# Patient Record
Sex: Female | Born: 1960
Health system: Western US, Academic
[De-identification: ages and names within clinical notes are randomized; demographics above are authoritative.]

## PROBLEM LIST (undated history)

## (undated) DIAGNOSIS — E119 Type 2 diabetes mellitus without complications: Secondary | ICD-10-CM

## (undated) DIAGNOSIS — Z9109 Other allergy status, other than to drugs and biological substances: Secondary | ICD-10-CM

## (undated) DIAGNOSIS — I10 Essential (primary) hypertension: Secondary | ICD-10-CM

## (undated) DIAGNOSIS — Z9221 Personal history of antineoplastic chemotherapy: Secondary | ICD-10-CM

## (undated) HISTORY — PX: REPLACEMENT TOTAL KNEE BILATERAL: SUR1225

## (undated) HISTORY — PX: ABDOMINAL HYSTERECTOMY: SHX81

## (undated) HISTORY — PX: ADENOIDECTOMY: SUR15

## (undated) HISTORY — PX: CATARACT EXTRACTION: SUR2

## (undated) HISTORY — PX: BILATERAL CARPAL TUNNEL RELEASE: SHX6508

---

## 2004-05-25 ENCOUNTER — Ambulatory Visit: Payer: Self-pay

## 2005-09-25 ENCOUNTER — Ambulatory Visit: Payer: Self-pay | Admitting: Unknown Physician Specialty

## 2006-12-10 ENCOUNTER — Ambulatory Visit: Payer: Self-pay

## 2009-09-12 ENCOUNTER — Ambulatory Visit: Payer: Self-pay

## 2010-09-28 ENCOUNTER — Ambulatory Visit: Payer: Self-pay

## 2011-11-28 ENCOUNTER — Ambulatory Visit: Payer: Self-pay

## 2013-04-29 ENCOUNTER — Ambulatory Visit: Payer: Self-pay

## 2013-05-06 ENCOUNTER — Ambulatory Visit: Payer: Self-pay | Admitting: Obstetrics and Gynecology

## 2013-05-06 LAB — BASIC METABOLIC PANEL
ANION GAP: 3 — AB (ref 7–16)
BUN: 20 mg/dL — ABNORMAL HIGH (ref 7–18)
CALCIUM: 8.6 mg/dL (ref 8.5–10.1)
Chloride: 107 mmol/L (ref 98–107)
Co2: 27 mmol/L (ref 21–32)
Creatinine: 0.74 mg/dL (ref 0.60–1.30)
EGFR (African American): >60
Glucose: 73 mg/dL (ref 65–99)
Osmolality: 275 (ref 275–301)
POTASSIUM: 3.4 mmol/L — AB (ref 3.5–5.1)
Sodium: 137 mmol/L (ref 136–145)

## 2013-05-06 LAB — PREGNANCY, URINE: Pregnancy Test, Urine: NEGATIVE m[IU]/mL

## 2013-05-06 LAB — HEMOGLOBIN: HGB: 13.1 g/dL (ref 12.0–16.0)

## 2013-05-08 ENCOUNTER — Ambulatory Visit: Payer: Self-pay | Admitting: Obstetrics and Gynecology

## 2013-05-11 LAB — PATHOLOGY REPORT

## 2013-06-01 ENCOUNTER — Ambulatory Visit: Payer: Self-pay | Admitting: Obstetrics and Gynecology

## 2013-06-02 LAB — BASIC METABOLIC PANEL
Anion Gap: 7 (ref 7–16)
BUN: 9 mg/dL (ref 7–18)
CALCIUM: 8 mg/dL — AB (ref 8.5–10.1)
Chloride: 108 mmol/L — ABNORMAL HIGH (ref 98–107)
Co2: 25 mmol/L (ref 21–32)
Creatinine: 0.8 mg/dL (ref 0.60–1.30)
EGFR (African American): >60
EGFR (Non-African Amer.): >60
GLUCOSE: 106 mg/dL — AB (ref 65–99)
OSMOLALITY: 279 (ref 275–301)
Potassium: 3.9 mmol/L (ref 3.5–5.1)
SODIUM: 140 mmol/L (ref 136–145)

## 2013-06-02 LAB — PATHOLOGY REPORT

## 2013-06-02 LAB — HEMATOCRIT: HCT: 32.7 % — ABNORMAL LOW (ref 35.0–47.0)

## 2013-09-08 DIAGNOSIS — E559 Vitamin D deficiency, unspecified: Secondary | ICD-10-CM | POA: Insufficient documentation

## 2013-09-24 ENCOUNTER — Ambulatory Visit: Payer: Self-pay | Admitting: Obstetrics and Gynecology

## 2013-09-25 ENCOUNTER — Ambulatory Visit: Payer: Self-pay | Admitting: Obstetrics and Gynecology

## 2014-03-01 ENCOUNTER — Ambulatory Visit: Payer: Self-pay | Admitting: Gastroenterology

## 2014-06-26 NOTE — Op Note (Signed)
PATIENT NAME:  Sandra Randall, Sandra Randall MR#:  [id number] DATE OF BIRTH:  1960-11-13  DATE OF PROCEDURE:  06/01/2013  PREOPERATIVE DIAGNOSIS: No urine output visualized from ureteral orifices.   POSTOPERATIVE DIAGNOSIS: No urine output visualized from ureteral orifices.   PROCEDURE: Cystoscopy with bilateral ureteral catheterization.   SURGEON: Maryan Puls, M.D.   ANESTHETIST: Dr. Kayleen Memos.   ANESTHETIC METHOD: General.   INDICATIONS: Intraoperative consultation was requested by Dr. Ouida Sills. He administered methylene blue and had not visualized any urine output from either ureter cystoscopically even after waiting 30 minutes. The patient had undergone vaginal hysterectomy. There is no history of urologic disease.   OPERATIVE SUMMARY: The 21-French cystoscope was coupled with the camera and then placed into the bladder. The bladder was thoroughly inspected. Both ureteral orifices were identified. Left orifice did have clear efflux. Right orifice did not have efflux during the 10 minutes I visualized it. At this point, a 0.035 Glidewire was easily advanced into the right orifice up to the kidney. No obstruction was encountered. The Glidewire wire was then easily advanced up the left ureter again without obstruction. At this point, the scope was removed. The procedure was terminated.   ____________________________ Otelia Limes. Yves Dill, MD mrw:aw D: 06/01/2013 11:56:22 ET T: 06/01/2013 12:31:12 ET JOB#: [id number]  cc: Otelia Limes. Yves Dill, MD, <Dictator> Royston Cowper MD ELECTRONICALLY SIGNED 06/01/2013 17:42

## 2014-06-26 NOTE — Op Note (Signed)
PATIENT NAME:  Sandra Randall, GUIDOTTI MR#:  [id number] DATE OF BIRTH:  1960-06-17  DATE OF PROCEDURE:  05/08/2013  PREOPERATIVE DIAGNOSIS: Menorrhagia.   POSTOPERATIVE DIAGNOSIS: Menorrhagia.   PROCEDURE: Fractional dilation and curettage.   SURGEON: Boykin Nearing, M.D.   INDICATION: A 54 year old gravida 2, para 1 patient with severe menorrhagia. The patient is  failing high dose OCPs. Endometrial biopsy pending from the office.   DESCRIPTION OF PROCEDURE: After adequate general endotracheal anesthesia, the patient was placed in the dorsal supine position with legs in the candycane stirrups. Abdomen, perineum and vagina were prepped in normal sterile fashion. A weighted speculum was placed in the posterior vaginal vault and the anterior vagina was elevated with Sims retractor and the cervix was grasped with a single-tooth tenaculum. Previously, the patient's bladder was straight catheterized yielding 150 mL of clear urine. An endocervical curettage was performed with scant tissue. The uterus the was then sounded, sounds to 9.5 cm. The cervix was then dilated to #20 Hanks dilator without difficulty and a sharp curettage was performed with moderate amount of endometrial tissue. Good uterine cry throughout and the procedure was terminated. Good hemostasis was noted. Silver nitrate was applied at the tenacula sites for hemostasis. No complications. The patient tolerated the procedure well and was taken to the recovery room in good condition. Estimated blood loss minimal. Intraoperative fluids were 650 mL. The patient was taken to the recovery room in good condition.  ____________________________ Boykin Nearing, MD tjs:aw D: 05/08/2013 11:04:28 ET T: 05/08/2013 11:15:28 ET JOB#: [id number]  cc: Boykin Nearing, MD, <Dictator> Boykin Nearing MD ELECTRONICALLY SIGNED 05/11/2013 7:51

## 2014-06-26 NOTE — Op Note (Signed)
PATIENT NAME:  Sandra Randall, BOEREMA MR#:  [id number] DATE OF BIRTH:  [birth year]  DATE OF PROCEDURE:  06/01/2013  PREOPERATIVE DIAGNOSIS: Menorrhagia failing both conservative treatment and ablative treatment.   POSTOPERATIVE DIAGNOSES:  1.  Menorrhagia failing both conservative treatment and ablative treatment. 2.  Fibroid uterus.   PROCEDURES:  1.  Total vaginal hysterectomy.  2.  Cystoscopy. 3.  Ureteral stenting.   SURGEON: Laverta Baltimore, MD.  ANESTHESIA: General endotracheal anesthesia.   INTRAOPERATIVE CONSULTATION: Dr. Rogers Blocker, urologist.   INDICATION: This is a 54 year old gravida 2, para 2 patient with a long history of significant menorrhagia. The patient was tried multiple times on hormonal therapy including high-dose birth control pills, which have failed. The patient had NovaSure endometrial ablation, which also failed to control her bleeding.   FINDINGS: Approximately a 13 week size uterus with multiple fibroids throughout. Cystoscopy performed at the end of the case given the difficult dissection and morcellation. Initial evaluation failed to show methylene blue coming from the ureteral ostia; therefore, Dr. Eliberto Ivory, urologist came in and performed retrograde ureteral stenting and ureterals were patent bilateral.   PROCEDURE: After adequate general endotracheal anesthesia, the patient was placed in the dorsal supine position, legs were placed in the candycane stirrups. The patient did have sequential calf devices on during the procedure. The patient received 2 grams of cefoxitin and 80 mg of gentamicin prophylactically before the start of the case. The patient was prepped and draped in normal sterile fashion. The bladder was catheterized with a red Robinson catheter yielding 75 mL clear urine. A weighted speculum was placed in the posterior vaginal vault and the cervix was circumferentially injected with 1% lidocaine with 1:100,000 epinephrine. A direct posterior colpotomy  incision was made and a long bill weighted speculum was placed. The uterosacral ligaments were bilaterally clamped, transected and suture ligated with 0 Vicryl suture. The anterior cervix was circumferentially incised with the Bovie. The cardinal ligaments were then clamped bilaterally transected and suture ligated with 0 Vicryl suture. Several additional clamps were used to clamp the uterine arteries bilaterally. These were transected and suture ligated with 0 Vicryl suture. Given the girth of the uterus, morcellation was then performed with a coring of the cervix and central portion of the uterus. Ultimately multiple fibroids and multiple pieces of the uterus were removed and the cornua were then visualized and clamped, transected and suture ligated with 0 Vicryl suture. Fallopian tubes were high. The ovaries appeared normal. Given the difficulty in her anatomy, the fallopian tubes were kept in situ. The pedicles appeared hemostatic. Two additional figure-of-eight sutures were applied on both sides to control slow oozing and ultimately good hemostasis was achieved.   The vaginal cuff was then closed with interrupted 0 Vicryl suture with good closure and good hemostasis. Given the difficult dissection and the amount of dissection that took place, the surgeon felt it was prudent to look at the ureters via cystoscope. The patient received an amp of methylene blue and the bladder was filled with lactated Ringer's while performing cystoscopy. Ureters were identified and after 20 minutes, there was no pluming effect noted and no methylene blue; therefore, Dr. Rogers Blocker, urologist on call was called in for intraoperative consultation. Dr. Rogers Blocker performed retrograde ureteral stenting and the stent passed without difficulty, therefore assuring that that ureters were not tied off. The left ureter did show a pluming of urine again without methylene blue. The right ureter never showed any pluming of either urine or methylene  blue. The stents were  removed, the bladder was drained and a Foley catheter was placed. Estimated blood loss 300 mL. Intraoperative fluids 2000 mL. There were no complications. The patient was taken to the recovery room in good condition.   ____________________________ Boykin Nearing, MD tjs:aw D: 06/01/2013 12:25:46 ET T: 06/01/2013 12:44:27 ET JOB#: [id number]  cc: Boykin Nearing, MD, <Dictator> Boykin Nearing MD ELECTRONICALLY SIGNED 06/05/2013 9:53

## 2015-03-03 DIAGNOSIS — Z6841 Body Mass Index (BMI) 40.0 and over, adult: Secondary | ICD-10-CM | POA: Insufficient documentation

## 2015-08-24 DIAGNOSIS — I480 Paroxysmal atrial fibrillation: Secondary | ICD-10-CM | POA: Insufficient documentation

## 2015-09-22 DIAGNOSIS — E785 Hyperlipidemia, unspecified: Secondary | ICD-10-CM | POA: Insufficient documentation

## 2015-09-22 DIAGNOSIS — E1169 Type 2 diabetes mellitus with other specified complication: Secondary | ICD-10-CM | POA: Insufficient documentation

## 2015-09-27 ENCOUNTER — Other Ambulatory Visit: Payer: Self-pay | Admitting: Obstetrics and Gynecology

## 2015-09-27 DIAGNOSIS — Z1231 Encounter for screening mammogram for malignant neoplasm of breast: Secondary | ICD-10-CM

## 2015-09-29 ENCOUNTER — Ambulatory Visit: Admission: RE | Admit: 2015-09-29 | Payer: Self-pay | Source: Ambulatory Visit

## 2015-09-29 ENCOUNTER — Other Ambulatory Visit: Payer: Self-pay | Admitting: Obstetrics and Gynecology

## 2015-09-29 ENCOUNTER — Ambulatory Visit
Admission: RE | Admit: 2015-09-29 | Discharge: 2015-09-29 | Disposition: A | Discharge status: Home / Self Care | Payer: BC Managed Care – PPO | Source: Ambulatory Visit | Attending: Obstetrics and Gynecology | Admitting: Obstetrics and Gynecology

## 2015-09-29 DIAGNOSIS — Z1231 Encounter for screening mammogram for malignant neoplasm of breast: Secondary | ICD-10-CM

## 2015-10-24 ENCOUNTER — Ambulatory Visit: Payer: BC Managed Care – PPO | Admitting: Dietician

## 2015-10-31 ENCOUNTER — Encounter: Payer: Self-pay | Admitting: Dietician

## 2015-10-31 NOTE — Progress Notes (Signed)
Patient missed initial MNT appointment on 10/24/15, and has not called back to reschedule. Sent letter to MD.

## 2015-11-15 DIAGNOSIS — G4731 Primary central sleep apnea: Secondary | ICD-10-CM | POA: Insufficient documentation

## 2016-12-07 ENCOUNTER — Other Ambulatory Visit: Payer: Self-pay | Admitting: Obstetrics and Gynecology

## 2016-12-07 DIAGNOSIS — Z1231 Encounter for screening mammogram for malignant neoplasm of breast: Secondary | ICD-10-CM

## 2017-01-08 ENCOUNTER — Other Ambulatory Visit: Payer: Self-pay | Admitting: Obstetrics and Gynecology

## 2017-01-08 ENCOUNTER — Ambulatory Visit
Admission: RE | Admit: 2017-01-08 | Discharge: 2017-01-08 | Disposition: A | Discharge status: Home / Self Care | Payer: BC Managed Care – PPO | Source: Ambulatory Visit | Attending: Obstetrics and Gynecology | Admitting: Obstetrics and Gynecology

## 2017-01-08 DIAGNOSIS — Z1231 Encounter for screening mammogram for malignant neoplasm of breast: Secondary | ICD-10-CM

## 2017-10-12 DIAGNOSIS — I152 Hypertension secondary to endocrine disorders: Secondary | ICD-10-CM | POA: Insufficient documentation

## 2018-01-20 ENCOUNTER — Other Ambulatory Visit: Payer: Self-pay | Admitting: Obstetrics and Gynecology

## 2018-01-20 DIAGNOSIS — Z1231 Encounter for screening mammogram for malignant neoplasm of breast: Secondary | ICD-10-CM

## 2018-02-05 ENCOUNTER — Ambulatory Visit
Admission: RE | Admit: 2018-02-05 | Discharge: 2018-02-05 | Disposition: A | Discharge status: Home / Self Care | Payer: BC Managed Care – PPO | Source: Ambulatory Visit | Attending: Obstetrics and Gynecology | Admitting: Obstetrics and Gynecology

## 2018-02-05 ENCOUNTER — Encounter (INDEPENDENT_AMBULATORY_CARE_PROVIDER_SITE_OTHER): Payer: Self-pay

## 2018-02-05 DIAGNOSIS — Z1231 Encounter for screening mammogram for malignant neoplasm of breast: Secondary | ICD-10-CM | POA: Diagnosis not present

## 2018-04-07 ENCOUNTER — Encounter (INDEPENDENT_AMBULATORY_CARE_PROVIDER_SITE_OTHER): Payer: BC Managed Care – PPO | Admitting: Vascular Surgery

## 2018-04-23 ENCOUNTER — Encounter (INDEPENDENT_AMBULATORY_CARE_PROVIDER_SITE_OTHER): Payer: BC Managed Care – PPO | Admitting: Vascular Surgery

## 2019-01-02 ENCOUNTER — Other Ambulatory Visit: Payer: Self-pay | Admitting: Obstetrics and Gynecology

## 2019-01-02 DIAGNOSIS — Z1231 Encounter for screening mammogram for malignant neoplasm of breast: Secondary | ICD-10-CM

## 2019-02-09 ENCOUNTER — Ambulatory Visit: Payer: BC Managed Care – PPO

## 2019-02-13 ENCOUNTER — Other Ambulatory Visit: Payer: Self-pay

## 2019-02-13 ENCOUNTER — Ambulatory Visit
Admission: RE | Admit: 2019-02-13 | Discharge: 2019-02-13 | Disposition: A | Discharge status: Home / Self Care | Payer: BC Managed Care – PPO | Source: Ambulatory Visit | Attending: Obstetrics and Gynecology | Admitting: Obstetrics and Gynecology

## 2019-02-13 DIAGNOSIS — Z1231 Encounter for screening mammogram for malignant neoplasm of breast: Secondary | ICD-10-CM | POA: Insufficient documentation

## 2019-04-13 ENCOUNTER — Ambulatory Visit (INDEPENDENT_AMBULATORY_CARE_PROVIDER_SITE_OTHER): Payer: BC Managed Care – PPO

## 2019-04-13 ENCOUNTER — Ambulatory Visit
Admission: EM | Admit: 2019-04-13 | Discharge: 2019-04-13 | Disposition: A | Discharge status: Home / Self Care | Payer: BC Managed Care – PPO | Attending: Emergency Medicine | Admitting: Emergency Medicine

## 2019-04-13 ENCOUNTER — Other Ambulatory Visit: Payer: Self-pay

## 2019-04-13 DIAGNOSIS — D649 Anemia, unspecified: Secondary | ICD-10-CM | POA: Diagnosis present

## 2019-04-13 DIAGNOSIS — R05 Cough: Secondary | ICD-10-CM

## 2019-04-13 DIAGNOSIS — R519 Headache, unspecified: Secondary | ICD-10-CM | POA: Diagnosis not present

## 2019-04-13 DIAGNOSIS — E119 Type 2 diabetes mellitus without complications: Secondary | ICD-10-CM | POA: Insufficient documentation

## 2019-04-13 DIAGNOSIS — G4733 Obstructive sleep apnea (adult) (pediatric): Secondary | ICD-10-CM | POA: Insufficient documentation

## 2019-04-13 DIAGNOSIS — I10 Essential (primary) hypertension: Secondary | ICD-10-CM | POA: Insufficient documentation

## 2019-04-13 DIAGNOSIS — J3489 Other specified disorders of nose and nasal sinuses: Secondary | ICD-10-CM | POA: Diagnosis not present

## 2019-04-13 DIAGNOSIS — R0609 Other forms of dyspnea: Secondary | ICD-10-CM | POA: Insufficient documentation

## 2019-04-13 DIAGNOSIS — R0600 Dyspnea, unspecified: Secondary | ICD-10-CM

## 2019-04-13 DIAGNOSIS — Z7984 Long term (current) use of oral hypoglycemic drugs: Secondary | ICD-10-CM | POA: Diagnosis not present

## 2019-04-13 DIAGNOSIS — M5136 Other intervertebral disc degeneration, lumbar region: Secondary | ICD-10-CM | POA: Insufficient documentation

## 2019-04-13 DIAGNOSIS — Z20822 Contact with and (suspected) exposure to covid-19: Secondary | ICD-10-CM | POA: Insufficient documentation

## 2019-04-13 DIAGNOSIS — M51369 Other intervertebral disc degeneration, lumbar region without mention of lumbar back pain or lower extremity pain: Secondary | ICD-10-CM | POA: Insufficient documentation

## 2019-04-13 DIAGNOSIS — Z79899 Other long term (current) drug therapy: Secondary | ICD-10-CM | POA: Insufficient documentation

## 2019-04-13 DIAGNOSIS — M5416 Radiculopathy, lumbar region: Secondary | ICD-10-CM | POA: Insufficient documentation

## 2019-04-13 DIAGNOSIS — Z7901 Long term (current) use of anticoagulants: Secondary | ICD-10-CM | POA: Diagnosis not present

## 2019-04-13 DIAGNOSIS — R0982 Postnasal drip: Secondary | ICD-10-CM | POA: Insufficient documentation

## 2019-04-13 HISTORY — DX: Type 2 diabetes mellitus without complications: E11.9

## 2019-04-13 HISTORY — DX: Other allergy status, other than to drugs and biological substances: Z91.09

## 2019-04-13 HISTORY — DX: Essential (primary) hypertension: I10

## 2019-04-13 LAB — CBC WITH DIFFERENTIAL/PLATELET
Abs Immature Granulocytes: 0.04 10*3/uL (ref 0.00–0.07)
Basophils Absolute: 0.1 10*3/uL (ref 0.0–0.1)
Basophils Relative: 1 %
Eosinophils Absolute: 0 10*3/uL (ref 0.0–0.5)
Eosinophils Relative: 0 %
HCT: 24.4 % — ABNORMAL LOW (ref 36.0–46.0)
Hemoglobin: 6.5 g/dL — ABNORMAL LOW (ref 12.0–15.0)
Immature Granulocytes: 1 %
Lymphocytes Relative: 23 %
Lymphs Abs: 1.6 10*3/uL (ref 0.7–4.0)
MCH: 19.6 pg — ABNORMAL LOW (ref 26.0–34.0)
MCHC: 26.6 g/dL — ABNORMAL LOW (ref 30.0–36.0)
MCV: 73.7 fL — ABNORMAL LOW (ref 80.0–100.0)
Monocytes Absolute: 0.5 10*3/uL (ref 0.1–1.0)
Monocytes Relative: 7 %
Neutro Abs: 4.7 10*3/uL (ref 1.7–7.7)
Neutrophils Relative %: 68 %
Platelets: 211 10*3/uL (ref 150–400)
RBC: 3.31 MIL/uL — ABNORMAL LOW (ref 3.87–5.11)
RDW: 21.5 % — ABNORMAL HIGH (ref 11.5–15.5)
WBC: 7 10*3/uL (ref 4.0–10.5)
nRBC: 0.3 % — ABNORMAL HIGH (ref 0.0–0.2)

## 2019-04-13 LAB — BASIC METABOLIC PANEL
Anion gap: 9 (ref 5–15)
BUN: 13 mg/dL (ref 6–20)
CO2: 23 mmol/L (ref 22–32)
Calcium: 8.9 mg/dL (ref 8.9–10.3)
Chloride: 104 mmol/L (ref 98–111)
Creatinine, Ser: 0.81 mg/dL (ref 0.44–1.00)
GFR calc Af Amer: >60 mL/min (ref >60)
GFR calc non Af Amer: >60 mL/min (ref >60)
Glucose, Bld: 163 mg/dL — ABNORMAL HIGH (ref 70–99)
Potassium: 3.8 mmol/L (ref 3.5–5.1)
Sodium: 136 mmol/L (ref 135–145)

## 2019-04-13 LAB — BRAIN NATRIURETIC PEPTIDE: B Natriuretic Peptide: 77 pg/mL (ref 0.0–100.0)

## 2019-04-13 NOTE — Discharge Instructions (Addendum)
Go immediately to the emergency department.  Your hemoglobin is 6.5, you do qualify for transfusion.  I believe this is the cause of your shortness of breath.

## 2019-04-13 NOTE — ED Triage Notes (Signed)
Pt presents with c/o shob w/exertion, dry cough and some nasal irritation, symptoms present about a week. Pt also reports all-over itching, denies rash. She does report dry skin, but the itching keeps her awake at night. Pt denies any known sick contacts.

## 2019-04-13 NOTE — ED Provider Notes (Signed)
HPI  SUBJECTIVE:  Sandra Randall is a 59 y.o. female who presents with dyspnea on exertion to about 50 feet.  States that she has to stop and rest after that distance.  This has been going on for over a week.  She denies shortness of breath at rest.  She reports cough productive of yellowish-green phlegm, left frontal sinus pain and pressure/headache with yellow-green rhinorrhea worse in the morning.  She reports postnasal drip.  No fevers, wheezing, chest pain, abdominal pain.  No unintentional weight gain, lower extremity edema, nocturia, orthopnea.  No body aches, sore throat, loss of sense of smell or taste nausea, vomiting, diarrhea.  No known Covid exposure.  No upper dental pain, facial swelling.  No chest pressure, heaviness, nausea, diaphoresis with exertion or at rest.  No surgery in the past 4 weeks, recent immobilization, hemoptysis, exogenous estrogen.  No antibiotics in the past 3 months.  No antipyretic in the past 4 to 6 hours.  She tried changing her allergy medications from Claritin and Flonase to Zyrtec and a prescription nasal spray without improvement in her symptoms.  Symptoms are worse with exertion.  She was in her usual state of health up until the past week.  She has never had symptoms like this before.  Denies epistaxis, vaginal bleeding, hematuria, melena, hematochezia.  She has a past medical history of obstructive sleep apnea on CPAP, diabetes, hypertension, atrial fibrillation for which she takes Eliquis.  She has a new adult onset murmur that was evaluated with an echo last month.  EF 55%.  She has a history of allergies.  No history of asthma, GERD, PE, DVT, mitral valve prolapse, aortic stenosis, CHF, anemia.  PMD: Ezequiel Kayser, MD    Past Medical History:  Diagnosis Date  . Diabetes mellitus without complication ([place])   . Environmental allergies   . Hypertension     Past Surgical History:  Procedure Laterality Date  . ABDOMINAL HYSTERECTOMY    .  ABDOMINAL HYSTERECTOMY    . ADENOIDECTOMY    . BILATERAL CARPAL TUNNEL RELEASE    . CATARACT EXTRACTION Bilateral   . REPLACEMENT TOTAL KNEE BILATERAL      Family History  Problem Relation Age of Onset  . Lung cancer Mother   . Hypertension Mother   . Lung cancer Father   . Breast cancer Neg Hx     Social History   Tobacco Use  . Smoking status: Never Smoker  . Smokeless tobacco: Never Used  Substance Use Topics  . Alcohol use: Not Currently  . Drug use: Never    No current facility-administered medications for this encounter.  Current Outpatient Medications:  .  amLODipine (NORVASC) 5 MG tablet, Take 5 mg by mouth daily., Disp: , Rfl:  .  cetirizine (ZYRTEC) 10 MG tablet, Take 10 mg by mouth daily., Disp: , Rfl:  .  Cholecalciferol 50 MCG (2000 UT) TABS, Take 2,000 Units by mouth daily., Disp: , Rfl:  .  ELIQUIS 5 MG TABS tablet, Take 5 mg by mouth 2 (two) times daily., Disp: , Rfl:  .  fluticasone (FLONASE) 50 MCG/ACT nasal spray, Place 2 sprays into the nose daily as needed., Disp: , Rfl:  .  liraglutide (VICTOZA) 18 MG/3ML SOPN, Inject 3 mg into the skin daily., Disp: , Rfl:  .  metFORMIN (GLUCOPHAGE) 500 MG tablet, Take 500 mg by mouth 2 (two) times daily., Disp: , Rfl:  .  metoprolol succinate (TOPROL-XL) 50 MG 24 hr tablet, Take 50  mg by mouth daily., Disp: , Rfl:  .  montelukast (SINGULAIR) 10 MG tablet, Take 10 mg by mouth at bedtime., Disp: , Rfl:  .  Multiple Vitamin (MULTIVITAMIN) capsule, Take by mouth., Disp: , Rfl:   Allergies  Allergen Reactions  . Adhesive [Tape] Rash     ROS  As noted in HPI.   Physical Exam  BP (!) 117/51 (BP Location: Left Arm)   Pulse 91   Temp 98.2 F (36.8 C) (Oral)   Ht 5\' 7"  (1.702 m)   SpO2 99%   Constitutional: Well developed, well nourished, no acute distress Eyes: PERRL, EOMI, conjunctiva normal bilaterally HENT: Normocephalic, atraumatic,mucus membranes moist.  Positive left frontal sinus tenderness.   Erythematous, swollen turbinates.  Mucoid nasal congestion. Respiratory: Clear to auscultation bilaterally, no rales, no wheezing, no rhonchi.  No anterior lateral chest wall tenderness. Cardiovascular: Normal rate and rhythm, positive systolic ejection murmur, no gallops, no rubs.  No JVD.  Able to tolerate lying flat. GI: Soft, nondistended, normal bowel sounds, nontender, no rebound, no guarding.  No appreciable hepatomegaly. skin: No rash, skin intact Musculoskeletal: Calves symmetric nontender bilaterally, 1-2+ pitting edema bilaterally, no tenderness, no deformities Neurologic: Alert & oriented x 3, CN III-XII grossly intact, no motor deficits, sensation grossly intact Psychiatric: Speech and behavior appropriate   ED Course   Medications - No data to display  Orders Placed This Encounter  Procedures  . Novel Coronavirus, NAA (Hosp order, Send-out to Ref Lab; TAT 18-24 hrs    Standing Status:   Standing    Number of Occurrences:   1    Order Specific Question:   Is this test for diagnosis or screening    Answer:   Screening    Order Specific Question:   Symptomatic for COVID-19 as defined by CDC    Answer:   No    Order Specific Question:   Hospitalized for COVID-19    Answer:   No    Order Specific Question:   Admitted to ICU for COVID-19    Answer:   No    Order Specific Question:   Previously tested for COVID-19    Answer:   No    Order Specific Question:   Resident in a congregate (group) care setting    Answer:   No    Order Specific Question:   Employed in healthcare setting    Answer:   No    Order Specific Question:   Pregnant    Answer:   No  . DG Chest 2 View    Standing Status:   Standing    Number of Occurrences:   1    Order Specific Question:   Reason for Exam (SYMPTOM  OR DIAGNOSIS REQUIRED)    Answer:   DOE x 1 week r/o effusion pulm edema  . CBC with Differential    Standing Status:   Standing    Number of Occurrences:   1  . Basic metabolic panel     Standing Status:   Standing    Number of Occurrences:   1  . Brain natriuretic peptide    Standing Status:   Standing    Number of Occurrences:   1  . ED EKG    Standing Status:   Standing    Number of Occurrences:   1    Order Specific Question:   Reason for Exam    Answer:   Shortness of breath  . EKG 12-Lead  Standing Status:   Standing    Number of Occurrences:   1   Results for orders placed or performed during the hospital encounter of 04/13/19 (from the past 24 hour(s))  CBC with Differential     Status: Abnormal   Collection Time: 04/13/19  5:20 PM  Result Value Ref Range   WBC 7.0 4.0 - 10.5 K/uL   RBC 3.31 (L) 3.87 - 5.11 MIL/uL   Hemoglobin 6.5 (L) 12.0 - 15.0 g/dL   HCT 24.4 (L) 36.0 - 46.0 %   MCV 73.7 (L) 80.0 - 100.0 fL   MCH 19.6 (L) 26.0 - 34.0 pg   MCHC 26.6 (L) 30.0 - 36.0 g/dL   RDW 21.5 (H) 11.5 - 15.5 %   Platelets 211 150 - 400 K/uL   nRBC 0.3 (H) 0.0 - 0.2 %   Neutrophils Relative % 68 %   Neutro Abs 4.7 1.7 - 7.7 K/uL   Lymphocytes Relative 23 %   Lymphs Abs 1.6 0.7 - 4.0 K/uL   Monocytes Relative 7 %   Monocytes Absolute 0.5 0.1 - 1.0 K/uL   Eosinophils Relative 0 %   Eosinophils Absolute 0.0 0.0 - 0.5 K/uL   Basophils Relative 1 %   Basophils Absolute 0.1 0.0 - 0.1 K/uL   Immature Granulocytes 1 %   Abs Immature Granulocytes 0.04 0.00 - 0.07 K/uL  Basic metabolic panel     Status: Abnormal   Collection Time: 04/13/19  5:20 PM  Result Value Ref Range   Sodium 136 135 - 145 mmol/L   Potassium 3.8 3.5 - 5.1 mmol/L   Chloride 104 98 - 111 mmol/L   CO2 23 22 - 32 mmol/L   Glucose, Bld 163 (H) 70 - 99 mg/dL   BUN 13 6 - 20 mg/dL   Creatinine, Ser 0.81 0.44 - 1.00 mg/dL   Calcium 8.9 8.9 - 10.3 mg/dL   GFR calc non Af Amer >60 >60 mL/min   GFR calc Af Amer >60 >60 mL/min   Anion gap 9 5 - 15   DG Chest 2 View  Result Date: 04/13/2019 CLINICAL DATA:  59 year old female with shortness of breath. EXAM: CHEST - 2 VIEW COMPARISON:  None.  FINDINGS: The heart size and mediastinal contours are within normal limits. Both lungs are clear. The visualized skeletal structures are unremarkable. IMPRESSION: No active cardiopulmonary disease. Electronically Signed   By: Anner Crete M.D.   On: 04/13/2019 17:28    ED Clinical Impression  1. Anemia, unspecified type   2. DOE (dyspnea on exertion)      ED Assessment/Plan  Outside labs, echo reports reviewed.  Last BUN/creatinine on 03/2019 18/0.7.  No CBC since 2018.  as noted in HPI  In the differential is CHF pneumonia bronchospasm acute anemia Covid infection.  PE, malignancy lower.  Modified Wells score 0/9. D-dimer was not done.  Covid PCR CBC BMP chest x-ray BNP sent.  Contact patient at [phone] if BMP positive.  If Covid positive she will be a candidate for the infusion clinic due to BMI of 50  EKG: Normal sinus rhythm, rate 91.  Normal axis.  Prolonged QT, incomplete right bundle branch block.  No ST-T wave changes.  No previous EKG images for comparison.  Previous QTC on EKG from 05/14/2016 490.  Reviewed imaging independently.  Normal chest x-ray. See radiology report for full details.  Hemoglobin 6.5.  Patient with acute anemia of unknown etiology, on Eliquis, and qualifies for transfusion.  suspect this  is the cause of her symptoms.  Transferring to the ED for further work-up and  transfusion.  Feel that she is stable to go via private vehicle given duration of symptoms and normal vital signs however feel that this needs to be done urgently because of the anticoagulation.Marland Kitchen  She wishes to go to the [facility].  Discussed labs, imaging, MDM, treatment plan, rationale for transfer to the emergency room.  She agrees with plan.  No orders of the defined types were placed in this encounter.   *This clinic note was created using Dragon dictation software. Therefore, there may be occasional mistakes despite careful proofreading.  ?   Melynda Ripple, MD 04/13/19 1944

## 2019-04-14 ENCOUNTER — Encounter: Payer: Self-pay | Admitting: Emergency Medicine

## 2019-04-14 LAB — NOVEL CORONAVIRUS, NAA (HOSP ORDER, SEND-OUT TO REF LAB; TAT 18-24 HRS): SARS-CoV-2, NAA: NOT DETECTED

## 2019-04-15 MED ORDER — MULTI-VITAMIN PO TABS
1.00 | ORAL_TABLET | ORAL | Status: DC
Start: 2019-04-19 — End: 2019-04-15

## 2019-04-15 MED ORDER — GENERIC EXTERNAL MEDICATION
40.00 | Status: DC
Start: 2019-04-15 — End: 2019-04-15

## 2019-04-15 MED ORDER — LIDOCAINE HCL 1 % IJ SOLN
0.50 | INTRAMUSCULAR | Status: DC
Start: ? — End: 2019-04-15

## 2019-04-15 MED ORDER — GLUCAGON (RDNA) 1 MG IJ KIT
1.00 | PACK | INTRAMUSCULAR | Status: DC
Start: ? — End: 2019-04-15

## 2019-04-15 MED ORDER — INSULIN REGULAR HUMAN 100 UNIT/ML IJ SOLN
0.00 | INTRAMUSCULAR | Status: DC
Start: 2019-04-15 — End: 2019-04-15

## 2019-04-15 MED ORDER — DIPHENHYDRAMINE HCL 25 MG PO CAPS
25.00 | ORAL_CAPSULE | ORAL | Status: DC
Start: ? — End: 2019-04-15

## 2019-04-15 MED ORDER — CHOLECALCIFEROL 25 MCG (1000 UT) PO TABS
2000.00 | ORAL_TABLET | ORAL | Status: DC
Start: 2019-04-19 — End: 2019-04-15

## 2019-04-15 MED ORDER — DEXTROSE 50 % IV SOLN
12.50 | INTRAVENOUS | Status: DC
Start: ? — End: 2019-04-15

## 2019-04-19 MED ORDER — METOPROLOL SUCCINATE ER 50 MG PO TB24
50.00 | ORAL_TABLET | ORAL | Status: DC
Start: 2019-04-19 — End: 2019-04-19

## 2019-04-19 MED ORDER — ACETAMINOPHEN 325 MG PO TABS
650.00 | ORAL_TABLET | ORAL | Status: DC
Start: ? — End: 2019-04-19

## 2019-04-19 MED ORDER — AMLODIPINE BESYLATE 5 MG PO TABS
5.00 | ORAL_TABLET | ORAL | Status: DC
Start: 2019-04-19 — End: 2019-04-19

## 2019-04-19 MED ORDER — HYDROXYZINE HCL 10 MG PO TABS
10.00 | ORAL_TABLET | ORAL | Status: DC
Start: ? — End: 2019-04-19

## 2019-04-19 MED ORDER — INSULIN REGULAR HUMAN 100 UNIT/ML IJ SOLN
0.00 | INTRAMUSCULAR | Status: DC
Start: 2019-04-18 — End: 2019-04-19

## 2019-04-19 MED ORDER — APIXABAN 5 MG PO TABS
5.00 | ORAL_TABLET | ORAL | Status: DC
Start: ? — End: 2019-04-19

## 2019-12-06 ENCOUNTER — Encounter: Payer: Self-pay | Admitting: Gynecology

## 2019-12-06 ENCOUNTER — Other Ambulatory Visit: Payer: Self-pay

## 2019-12-06 ENCOUNTER — Ambulatory Visit
Admission: EM | Admit: 2019-12-06 | Discharge: 2019-12-06 | Disposition: A | Discharge status: Home / Self Care | Payer: BC Managed Care – PPO | Attending: Physician Assistant | Admitting: Physician Assistant

## 2019-12-06 DIAGNOSIS — R5383 Other fatigue: Secondary | ICD-10-CM | POA: Diagnosis present

## 2019-12-06 DIAGNOSIS — D509 Iron deficiency anemia, unspecified: Secondary | ICD-10-CM | POA: Insufficient documentation

## 2019-12-06 LAB — CBC WITH DIFFERENTIAL/PLATELET
Abs Immature Granulocytes: 0.07 10*3/uL (ref 0.00–0.07)
Basophils Absolute: 0.1 10*3/uL (ref 0.0–0.1)
Basophils Relative: 1 %
Eosinophils Absolute: 0 10*3/uL (ref 0.0–0.5)
Eosinophils Relative: 0 %
HCT: 36 % (ref 36.0–46.0)
Hemoglobin: 10.6 g/dL — ABNORMAL LOW (ref 12.0–15.0)
Immature Granulocytes: 1 %
Lymphocytes Relative: 19 %
Lymphs Abs: 1.7 10*3/uL (ref 0.7–4.0)
MCH: 25.7 pg — ABNORMAL LOW (ref 26.0–34.0)
MCHC: 29.4 g/dL — ABNORMAL LOW (ref 30.0–36.0)
MCV: 87.4 fL (ref 80.0–100.0)
Monocytes Absolute: 0.7 10*3/uL (ref 0.1–1.0)
Monocytes Relative: 8 %
Neutro Abs: 6.3 10*3/uL (ref 1.7–7.7)
Neutrophils Relative %: 71 %
Platelets: 212 10*3/uL (ref 150–400)
RBC: 4.12 MIL/uL (ref 3.87–5.11)
RDW: 19 % — ABNORMAL HIGH (ref 11.5–15.5)
WBC: 8.8 10*3/uL (ref 4.0–10.5)
nRBC: 0 % (ref 0.0–0.2)

## 2019-12-06 LAB — BASIC METABOLIC PANEL
Anion gap: 8 (ref 5–15)
BUN: 28 mg/dL — ABNORMAL HIGH (ref 6–20)
CO2: 27 mmol/L (ref 22–32)
Calcium: 9.7 mg/dL (ref 8.9–10.3)
Chloride: 104 mmol/L (ref 98–111)
Creatinine, Ser: 0.81 mg/dL (ref 0.44–1.00)
GFR calc Af Amer: >60 mL/min (ref >60)
GFR calc non Af Amer: >60 mL/min (ref >60)
Glucose, Bld: 112 mg/dL — ABNORMAL HIGH (ref 70–99)
Potassium: 4.3 mmol/L (ref 3.5–5.1)
Sodium: 139 mmol/L (ref 135–145)

## 2019-12-06 NOTE — ED Provider Notes (Signed)
[facility]    CSN: [id number] Arrival date & time: 12/06/19  1411      History   Chief Complaint Chief Complaint  Patient presents with  . Dizziness  . Anemia    HPI Sandra Randall is a 59 y.o. female presenting for feeling increasingly fatigued and dizzy over the past month.  She states that she has a history of iron deficiency anemia has had to have multiple blood transfusions in the past.  Patient states that she is to take Eliquis and has not been taking the medication several months.  Her last blood transfusion was about 6 to 7 months ago.  Patient states that she started to feel like she did before she had to have that transfusion.  Patient states that she does have a hematologist that she goes to, but does not take any thing for her anemia.  Patient denies any history of associated fever, cough, congestion, chest pain, breathing quality, abdominal pain, back pain, nausea, vomiting, diarrhea.  She has not noticed any blood in the stool or in the urine.  Has not noticed any bleeding from any sites and has not had any bruising.  She said her last hemoglobin was done about 2 weeks ago and it was 10.  Patient does have a history of A. fib but says it was paroxysmal she has not had an event in 4 years.  She said her cardiologist felt comfortable taking her off of the Eliquis after she had ablation and did not have recurrence of the A. fib.  Patient denies feeling any palpitations.  She is mostly just concerned about her hemoglobin probably being low requiring another transfusion.  She has no other concerns today.  HPI  Past Medical History:  Diagnosis Date  . Diabetes mellitus without complication ([place])   . Environmental allergies   . Hypertension     Patient Active Problem List   Diagnosis Date Noted  . Lumbar radiculopathy 04/13/2019  . Degeneration of lumbar intervertebral disc 04/13/2019  . Hypertension associated with type 2 diabetes mellitus ([place])  10/12/2017  . 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 Type 2 diabetes mellitus with other specified complication ([place]) 75/17/0017  . Hyperlipidemia associated with type 2 diabetes mellitus ([place]) 09/22/2015  . Paroxysmal atrial fibrillation ([place]) 08/24/2015  . BMI 50.0-59.9, adult ([place]) 03/03/2015  . Vitamin D deficiency, unspecified 09/08/2013    Past Surgical History:  Procedure Laterality Date  . ABDOMINAL HYSTERECTOMY    . ABDOMINAL HYSTERECTOMY    . ADENOIDECTOMY    . BILATERAL CARPAL TUNNEL RELEASE    . CATARACT EXTRACTION Bilateral   . REPLACEMENT TOTAL KNEE BILATERAL      OB History   No obstetric history on file.      Home Medications    Prior to Admission medications   Medication Sig Start Date End Date Taking? Authorizing Provider  amLODipine (NORVASC) 5 MG tablet Take 5 mg by mouth daily. 02/20/19  Yes [provider]  cetirizine (ZYRTEC) 10 MG tablet Take 10 mg by mouth daily.   Yes [provider]  Cholecalciferol 50 MCG (2000 UT) TABS Take 2,000 Units by mouth daily. 03/05/18  Yes [provider]  liraglutide (VICTOZA) 18 MG/3ML SOPN Inject 3 mg into the skin daily. 01/13/18  Yes [provider]  metoprolol succinate (TOPROL-XL) 50 MG 24 hr tablet Take 50 mg by mouth daily. 01/16/19  Yes [provider]  Multiple Vitamin (MULTIVITAMIN) capsule Take by mouth.  Yes [provider]  ELIQUIS 5 MG TABS tablet Take 5 mg by mouth 2 (two) times daily. 02/24/19   [provider]  fluticasone (FLONASE) 50 MCG/ACT nasal spray Place 2 sprays into the nose daily as needed.    [provider]  metFORMIN (GLUCOPHAGE) 500 MG tablet Take 500 mg by mouth 2 (two) times daily. 03/23/19   [provider]  montelukast (SINGULAIR) 10 MG tablet Take 10 mg by mouth at bedtime. 03/23/19   [provider]    Family History Family History  Problem Relation Age of Onset  . Lung cancer Mother   .  Hypertension Mother   . Lung cancer Father   . Breast cancer Neg Hx     Social History Social History   Tobacco Use  . Smoking status: Never Smoker  . Smokeless tobacco: Never Used  Vaping Use  . Vaping Use: Never used  Substance Use Topics  . Alcohol use: Not Currently  . Drug use: Never     Allergies   Adhesive [tape]   Review of Systems Review of Systems  Constitutional: Positive for fatigue. Negative for chills, diaphoresis and fever.  HENT: Negative for congestion, rhinorrhea, sinus pressure and sore throat.   Respiratory: Positive for shortness of breath (mild, intermitent). Negative for cough, chest tightness and wheezing.   Cardiovascular: Negative for chest pain, palpitations and leg swelling.  Gastrointestinal: Negative for abdominal pain, nausea and vomiting.  Musculoskeletal: Negative for arthralgias and myalgias.  Skin: Negative for rash.  Neurological: Positive for dizziness and light-headedness. Negative for syncope, weakness, numbness and headaches.  Hematological: Negative for adenopathy.     Physical Exam Triage Vital Signs ED Triage Vitals  Enc Vitals Group     BP 12/06/19 1453 123/60     Pulse Rate 12/06/19 1453 68     Resp 12/06/19 1453 18     Temp 12/06/19 1453 98.1 F (36.7 C)     Temp Source 12/06/19 1453 Oral     SpO2 12/06/19 1453 98 %     Weight 12/06/19 1454 261 lb (118.4 kg)     Height 12/06/19 1454 5\' 6"  (1.676 m)     Head Circumference --      Peak Flow --      Pain Score 12/06/19 1452 0     Pain Loc --      Pain Edu? --      Excl. in [place]? --    No data found.  Updated Vital Signs BP 123/60 (BP Location: Left Arm)   Pulse 68   Temp 98.1 F (36.7 C) (Oral)   Resp 18   Ht 5\' 6"  (1.676 m)   Wt 261 lb (118.4 kg)   SpO2 98%   BMI 42.13 kg/m       Physical Exam Vitals and nursing note reviewed.  Constitutional:      General: She is not in acute distress.    Appearance: Normal appearance. She is obese. She is not  ill-appearing or toxic-appearing.  HENT:     Head: Normocephalic and atraumatic.     Nose: Nose normal.     Mouth/Throat:     Mouth: Mucous membranes are moist.     Pharynx: Oropharynx is clear.  Eyes:     General: No scleral icterus.       Right eye: No discharge.        Left eye: No discharge.     Conjunctiva/sclera: Conjunctivae normal.  Cardiovascular:  Rate and Rhythm: Normal rate and regular rhythm.     Heart sounds: Normal heart sounds.  Pulmonary:     Effort: Pulmonary effort is normal. No respiratory distress.     Breath sounds: Normal breath sounds. No wheezing, rhonchi or rales.  Abdominal:     Palpations: Abdomen is soft.     Tenderness: There is no abdominal tenderness.  Musculoskeletal:     Cervical back: Neck supple.     Right lower leg: No edema.     Left lower leg: No edema.  Skin:    General: Skin is dry.  Neurological:     General: No focal deficit present.     Mental Status: She is alert. Mental status is at baseline.     Motor: No weakness.     Gait: Gait normal.  Psychiatric:        Mood and Affect: Mood normal.        Behavior: Behavior normal.        Thought Content: Thought content normal.      UC Treatments / Results  Labs (all labs ordered are listed, but only abnormal results are displayed) Labs Reviewed  BASIC METABOLIC PANEL - Abnormal; Notable for the following components:      Result Value   Glucose, Bld 112 (*)    BUN 28 (*)    All other components within normal limits  CBC WITH DIFFERENTIAL/PLATELET - Abnormal; Notable for the following components:   Hemoglobin 10.6 (*)    MCH 25.7 (*)    MCHC 29.4 (*)    RDW 19.0 (*)    All other components within normal limits    EKG   Radiology No results found.  Procedures Procedures (including critical care time)  Medications Ordered in UC Medications - No data to display  Initial Impression / Assessment and Plan / UC Course  I have reviewed the triage vital signs and the  nursing notes.  Pertinent labs & imaging results that were available during my care of the patient were reviewed by me and considered in my medical decision making (see chart for details).   59 year old female with history of chronic anemia and diabetes presenting for increased fatigue and dizziness over the past month.  Exam within normal limits.  All vital signs stable and normal.  Labs performed today which shows hemoglobin at 10.6.  Patient states that is higher than what was 2 weeks ago.  Glucose slightly elevated at 112.  Patient does have a remote history of paroxysmal A. fib so advised her to have an EKG performed.  Patient declines and says that her apple watch keeps track of her heart rate for her and she has not had any issues with A. fib in many years since she is post ablation.  Patient denies any chest pain or palpitations.  States shortness of breath is only if she is exerting herself.  Advised patient that there are other causes for her symptoms that could and should be investigated with EKG and chest x-ray and potentially other labs.  Patient states that she was only interested in checking her hemoglobin today.  Advised patient to follow-up with her hematologist as soon as possible.  Advised her to go to the emergency department sooner if she develops any worsening symptoms including worsening dizziness, fatigue or shortness of breath.  Patient agreeable.  Patient declined AVS.  Final Clinical Impressions(s) / UC Diagnoses   Final diagnoses:  Iron deficiency anemia, unspecified iron deficiency anemia type  Fatigue, unspecified type   Discharge Instructions   None    ED Prescriptions    None     PDMP not reviewed this encounter.   Danton Clap, PA-C 12/07/19 2045

## 2019-12-06 NOTE — ED Triage Notes (Signed)
Patient c/o dizziness and anemia x 1 month.

## 2020-01-14 ENCOUNTER — Other Ambulatory Visit: Payer: Self-pay | Admitting: Internal Medicine

## 2020-01-14 DIAGNOSIS — Z1231 Encounter for screening mammogram for malignant neoplasm of breast: Secondary | ICD-10-CM

## 2020-02-17 ENCOUNTER — Ambulatory Visit
Admission: RE | Admit: 2020-02-17 | Discharge: 2020-02-17 | Disposition: A | Discharge status: Home / Self Care | Payer: BC Managed Care – PPO | Source: Ambulatory Visit | Attending: Internal Medicine | Admitting: Internal Medicine

## 2020-02-17 ENCOUNTER — Other Ambulatory Visit: Payer: Self-pay

## 2020-02-17 DIAGNOSIS — Z1231 Encounter for screening mammogram for malignant neoplasm of breast: Secondary | ICD-10-CM | POA: Insufficient documentation

## 2020-09-02 DIAGNOSIS — C801 Malignant (primary) neoplasm, unspecified: Secondary | ICD-10-CM

## 2020-09-02 HISTORY — DX: Malignant (primary) neoplasm, unspecified: C80.1

## 2020-10-10 ENCOUNTER — Telehealth

## 2020-10-10 NOTE — Telephone Encounter
Diagnosis- Small bowel abdominal carcinoma   [] - Newly Diagnosed    [x] - Second opinion  [] - Continuation of care    ? Type of insurance/Is auth needed? If so, status of auth? [employer] shield, patient will call back with insurance information.     ? Date/Time of when appt was scheduled     ? Referring MD/ph. Number:     ? Status of medical records  [] - [facility]  [] - Epic care everywhere   [x] - Outside facility/ office/ Hospital- Patient has been provided our fax number and medical records will be faxed    ? Where new patient paperwork is supposed to be sent  [] - Office   [x] - Mailed to home     ? Any other pertinent information:  [place] and would like to have initial appointment as a video visit, provided link to Mychart and instructions on how a video visit is done.

## 2021-01-17 ENCOUNTER — Other Ambulatory Visit: Payer: Self-pay | Admitting: Family Medicine

## 2021-01-17 DIAGNOSIS — Z1231 Encounter for screening mammogram for malignant neoplasm of breast: Secondary | ICD-10-CM

## 2021-02-21 ENCOUNTER — Ambulatory Visit: Payer: BC Managed Care – PPO

## 2021-02-23 ENCOUNTER — Ambulatory Visit
Admission: RE | Admit: 2021-02-23 | Discharge: 2021-02-23 | Disposition: A | Discharge status: Home / Self Care | Payer: BC Managed Care – PPO | Source: Ambulatory Visit | Attending: Family Medicine | Admitting: Family Medicine

## 2021-02-23 ENCOUNTER — Other Ambulatory Visit: Payer: Self-pay

## 2021-02-23 DIAGNOSIS — Z1231 Encounter for screening mammogram for malignant neoplasm of breast: Secondary | ICD-10-CM

## 2021-02-23 HISTORY — DX: Personal history of antineoplastic chemotherapy: Z92.21

## 2022-03-05 DEATH — deceased

## 2022-04-05 DEATH — deceased

## 2022-05-29 IMAGING — MG MM DIGITAL SCREENING BILAT W/ TOMO AND CAD
6 of 10 series · 6 of 30 positions shown · non-contrast
Comparison: Previous exam(s).

ACR Breast Density Category a: The breast tissue is almost entirely
fatty.

CLINICAL DATA: Screening.

EXAM:
DIGITAL SCREENING BILATERAL MAMMOGRAM WITH TOMOSYNTHESIS AND CAD
TECHNIQUE: Bilateral screening digital craniocaudal and mediolateral oblique
mammograms were obtained. Bilateral screening digital breast
tomosynthesis was performed. The images were evaluated with
computer-aided detection.

[L MLO synth-2D]
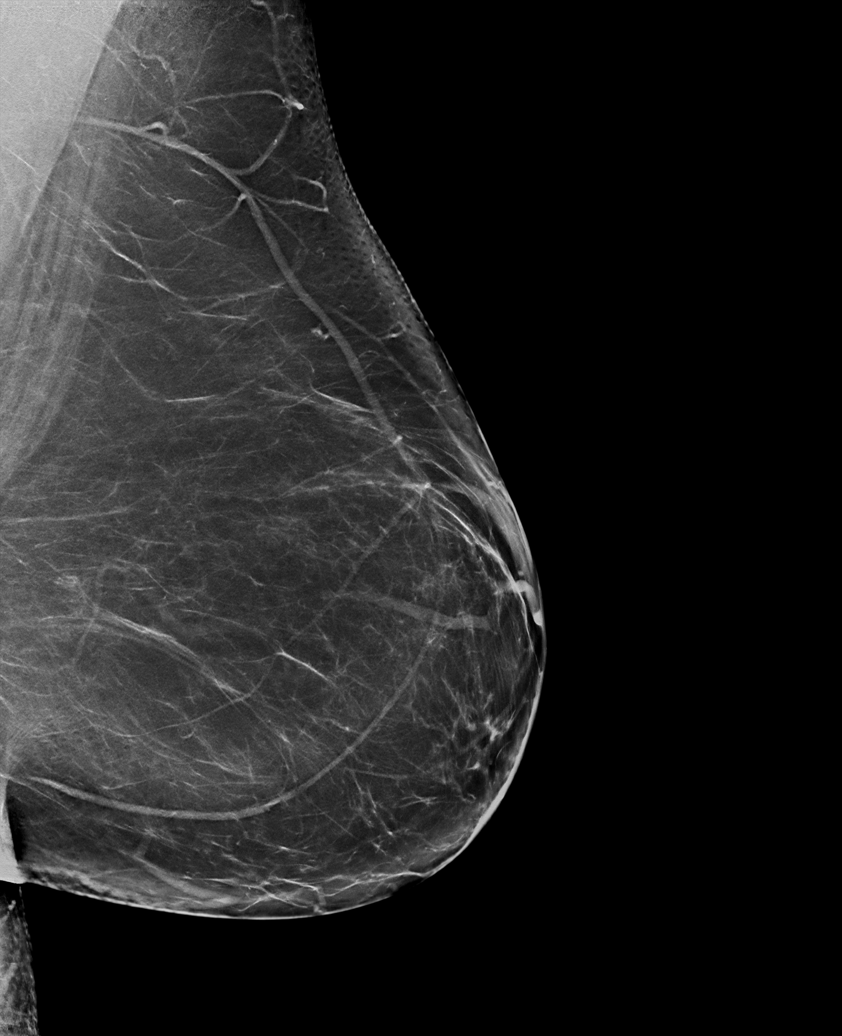

[R MLO synth-2D (1 of 2)]
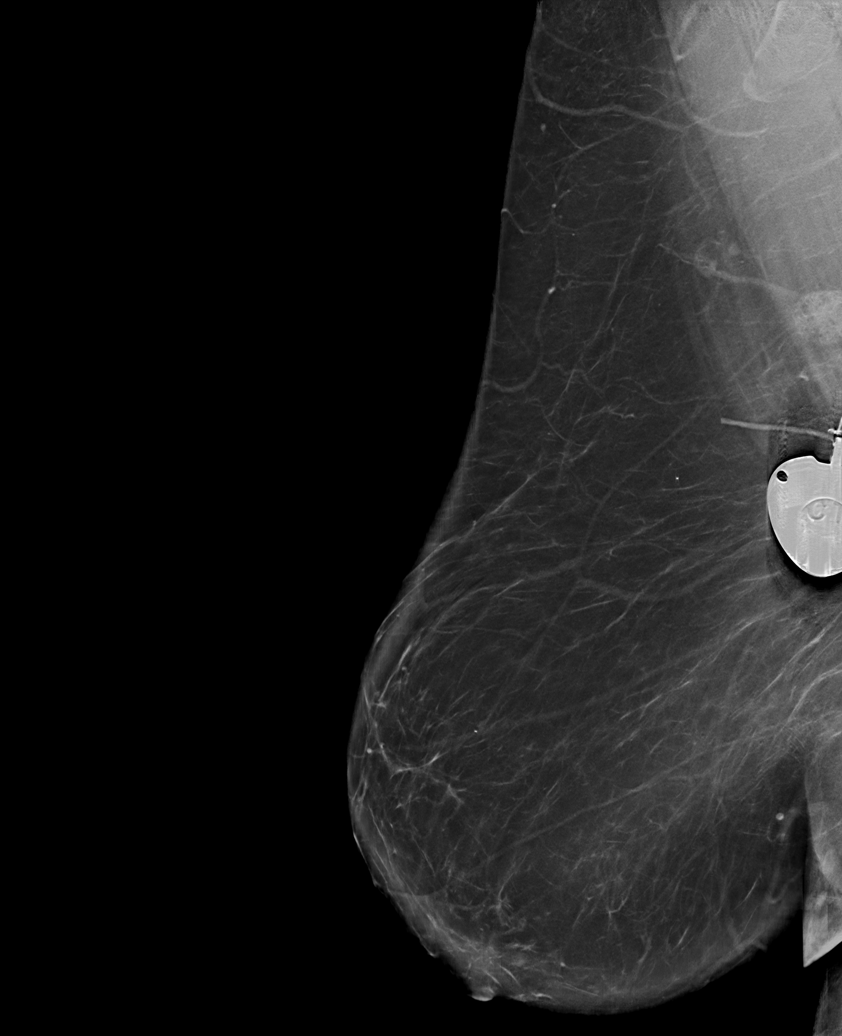

[L CC synth-2D]
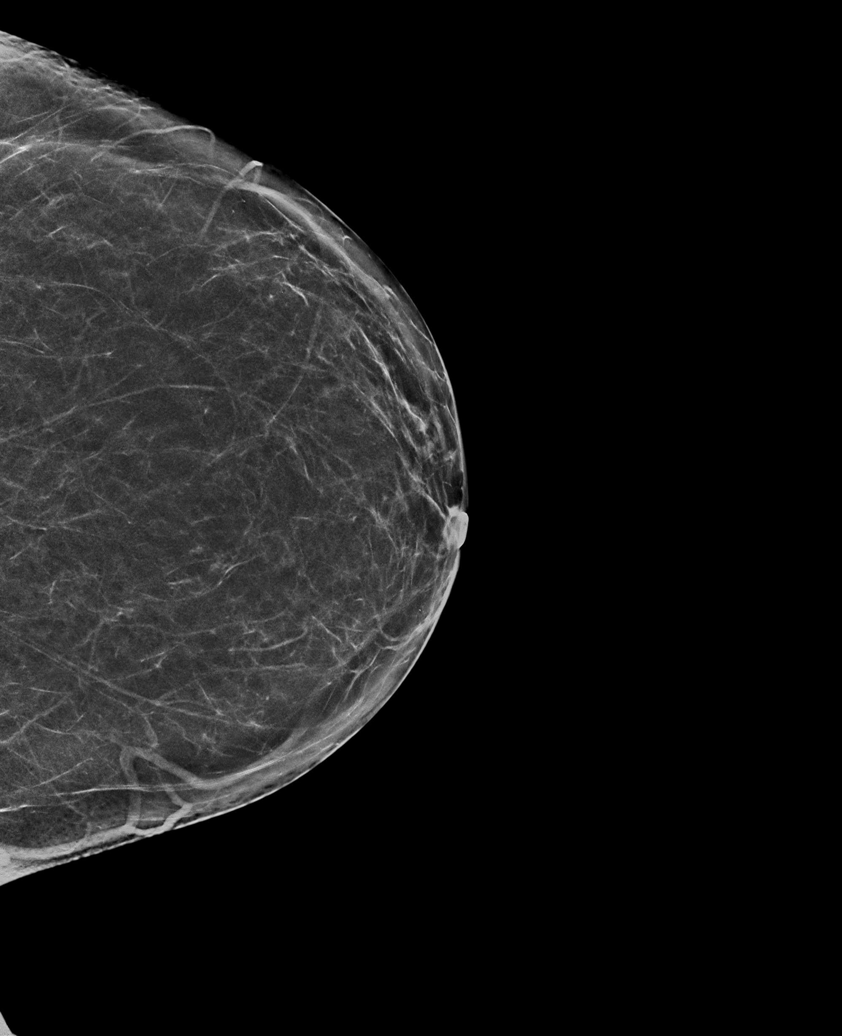

[R MLO synth-2D (2 of 2)]
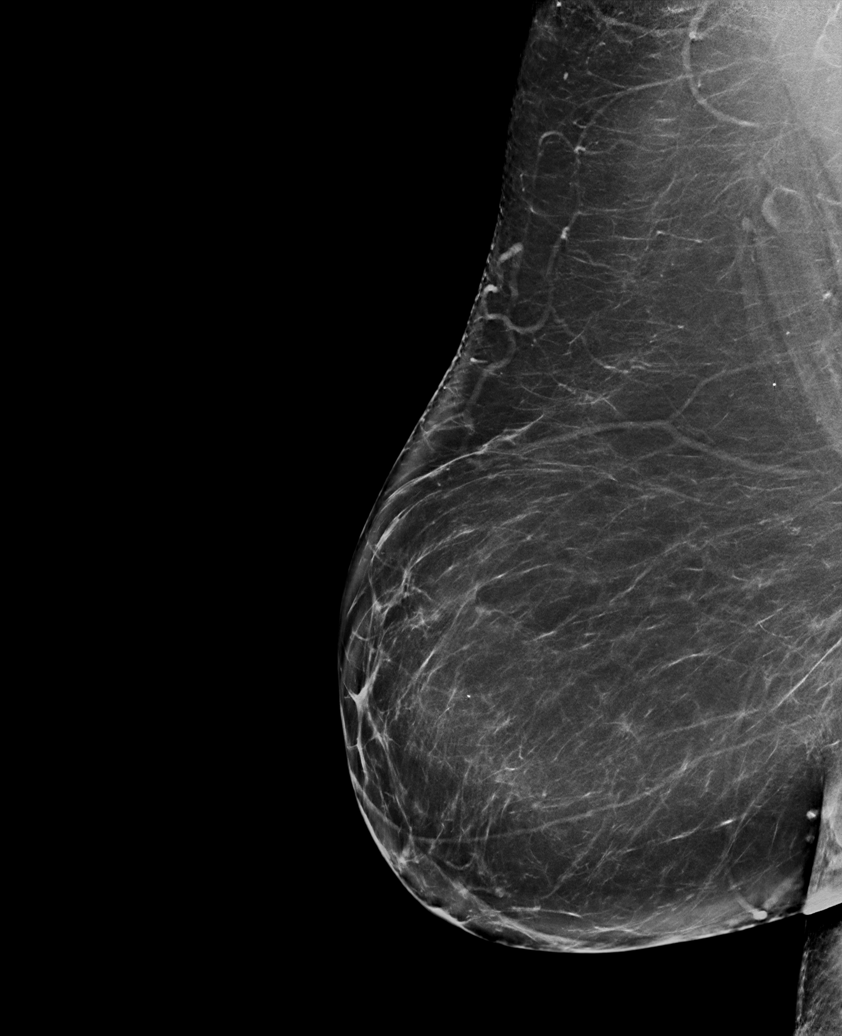

[R CC synth-2D]
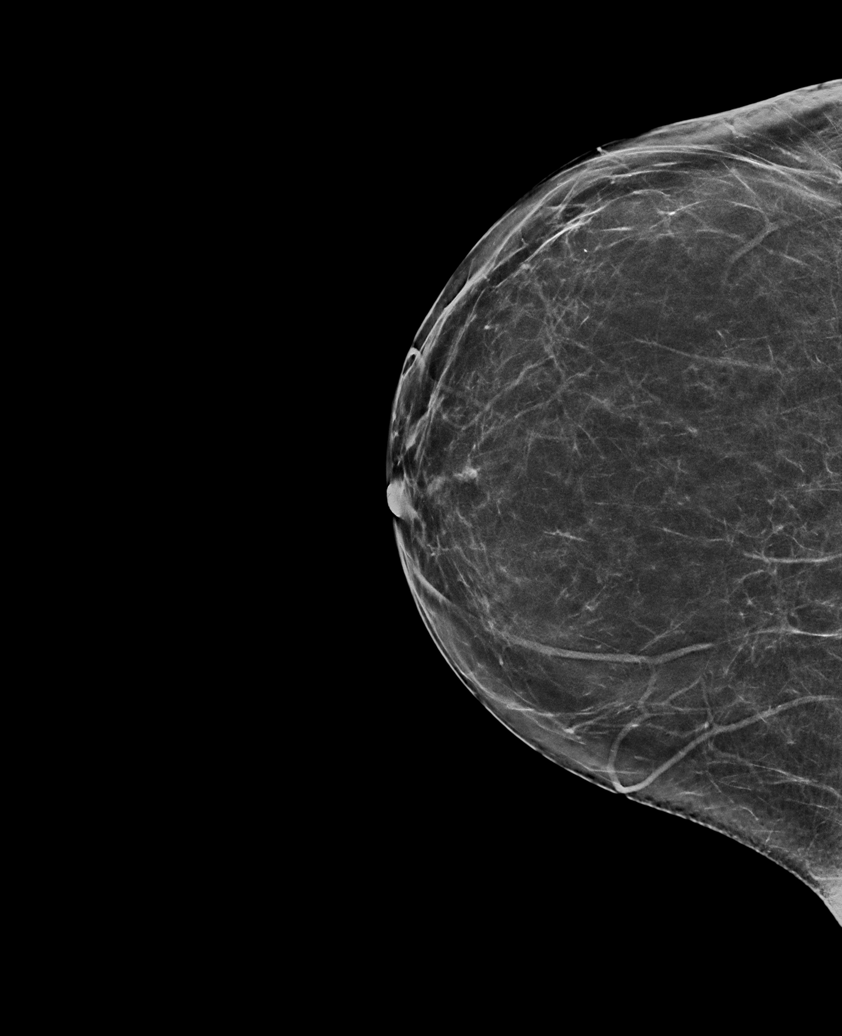

[R CC tomo · tomo slice 35/69.0]
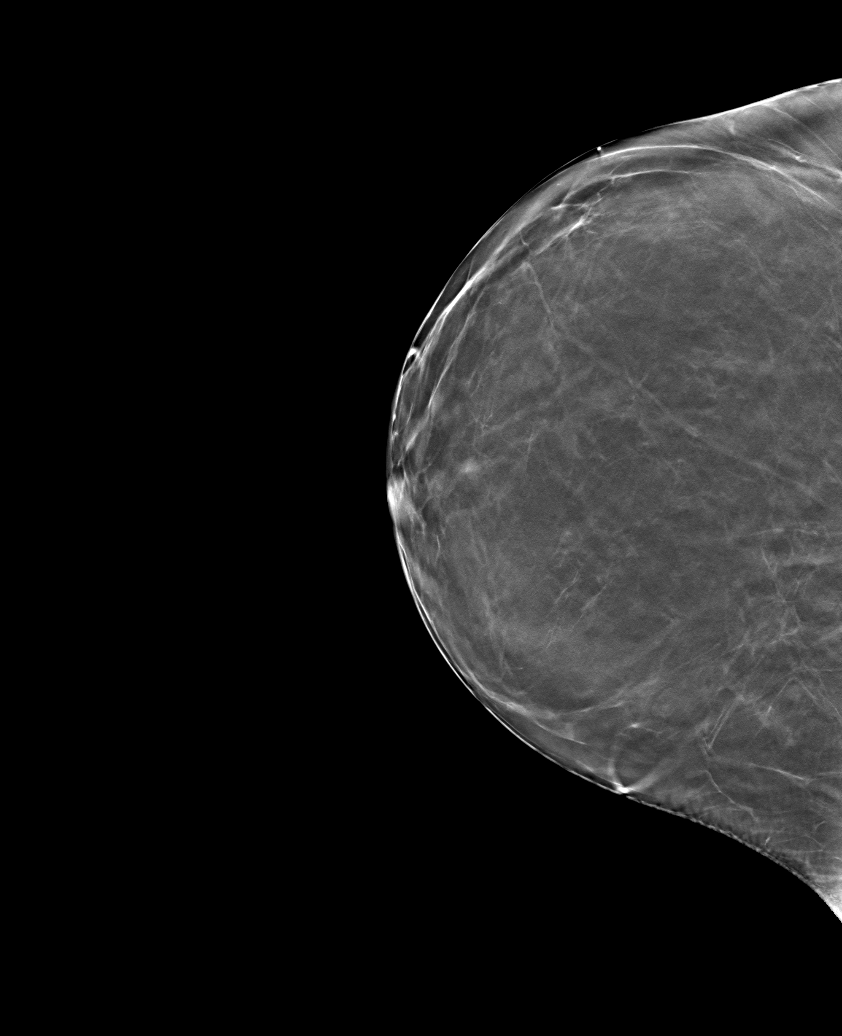

[6 of 30 positions shown; findings below may reference images not displayed]

FINDINGS: There are no findings suspicious for malignancy.
IMPRESSION: No mammographic evidence of malignancy. A result letter of this
screening mammogram will be mailed directly to the patient.

RECOMMENDATION:
Screening mammogram in one year. (Code:0E-3-N98)

BI-RADS CATEGORY  1: Negative.
